# Patient Record
Sex: Female | Born: 1995 | Race: White | Hispanic: No | Marital: Single | Smoking: Never smoker
Health system: Southern US, Community
[De-identification: ages and names within clinical notes are randomized; demographics above are authoritative.]

## PROBLEM LIST (undated history)

## (undated) DIAGNOSIS — F32A Depression, unspecified: Secondary | ICD-10-CM

## (undated) DIAGNOSIS — F329 Major depressive disorder, single episode, unspecified: Secondary | ICD-10-CM

## (undated) DIAGNOSIS — Q211 Atrial septal defect: Secondary | ICD-10-CM

## (undated) DIAGNOSIS — F5000 Anorexia nervosa, unspecified: Secondary | ICD-10-CM

## (undated) DIAGNOSIS — F909 Attention-deficit hyperactivity disorder, unspecified type: Secondary | ICD-10-CM

## (undated) DIAGNOSIS — Q2112 Patent foramen ovale: Secondary | ICD-10-CM

## (undated) DIAGNOSIS — G43909 Migraine, unspecified, not intractable, without status migrainosus: Secondary | ICD-10-CM

## (undated) DIAGNOSIS — J40 Bronchitis, not specified as acute or chronic: Secondary | ICD-10-CM

## (undated) DIAGNOSIS — N83209 Unspecified ovarian cyst, unspecified side: Secondary | ICD-10-CM

## (undated) HISTORY — PX: WISDOM TOOTH EXTRACTION: SHX21

---
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
## 2013-06-05 ENCOUNTER — Emergency Department (HOSPITAL_BASED_OUTPATIENT_CLINIC_OR_DEPARTMENT_OTHER)
Admission: EM | Admit: 2013-06-05 | Discharge: 2013-06-05 | Disposition: A | Discharge status: Home / Self Care | Payer: BC Managed Care – PPO | Attending: Emergency Medicine | Admitting: Emergency Medicine

## 2013-06-05 ENCOUNTER — Encounter (HOSPITAL_BASED_OUTPATIENT_CLINIC_OR_DEPARTMENT_OTHER): Payer: Self-pay | Admitting: Emergency Medicine

## 2013-06-05 DIAGNOSIS — J3489 Other specified disorders of nose and nasal sinuses: Secondary | ICD-10-CM | POA: Insufficient documentation

## 2013-06-05 DIAGNOSIS — H1045 Other chronic allergic conjunctivitis: Secondary | ICD-10-CM | POA: Insufficient documentation

## 2013-06-05 DIAGNOSIS — Z792 Long term (current) use of antibiotics: Secondary | ICD-10-CM | POA: Insufficient documentation

## 2013-06-05 DIAGNOSIS — Z79899 Other long term (current) drug therapy: Secondary | ICD-10-CM | POA: Insufficient documentation

## 2013-06-05 DIAGNOSIS — IMO0002 Reserved for concepts with insufficient information to code with codable children: Secondary | ICD-10-CM | POA: Insufficient documentation

## 2013-06-05 DIAGNOSIS — H1010 Acute atopic conjunctivitis, unspecified eye: Secondary | ICD-10-CM

## 2013-06-05 MED ORDER — PREDNISONE 50 MG PO TABS
60.0000 mg | ORAL_TABLET | Freq: Once | ORAL | Status: AC
  Administered 2013-06-05: 60 mg via ORAL
  Filled 2013-06-05 (×2): qty 1

## 2013-06-05 MED ORDER — CIPROFLOXACIN HCL 0.3 % OP SOLN
1.0000 [drp] | OPHTHALMIC | Status: DC
  Administered 2013-06-05: 1 [drp] via OPHTHALMIC
  Filled 2013-06-05: qty 2.5

## 2013-06-05 MED ORDER — FLUORESCEIN SODIUM 1 MG OP STRP
1.0000 | ORAL_STRIP | Freq: Once | OPHTHALMIC | Status: AC
  Administered 2013-06-05: 1 via OPHTHALMIC
  Filled 2013-06-05: qty 1

## 2013-06-05 NOTE — ED Notes (Signed)
ptm reports awoke this morning with moderate eye pain and blurred vision denies injury or event prior to problem

## 2013-06-05 NOTE — Discharge Instructions (Signed)

## 2013-06-05 NOTE — ED Provider Notes (Signed)
CSN: [id number]     Arrival date & time 06/05/13  2032 History   First MD Initiated Contact with Patient 06/05/13 2103    Scribed for No att. providers found, the patient was seen in room MH12/MH12. This chart was scribed by Lewanda Rife, ED scribe. Patient's care was started at [facility] PM  Chief Complaint  Patient presents with  . Eye Pain     (Consider location/radiation/quality/duration/timing/severity/associated sxs/prior Treatment) The history is provided by the patient. No language interpreter was used.   HPI Comments: Carla James is a 18 y.o. female who presents to the Emergency Department complaining of constant moderate bilateral eye pain onset this morning upon waking. Describes eye pain as foreign body sensation. Reports associated mildly blurry vision, and mild rhinorrhea. 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 Reports finding an extra contact in right eye today. Reports trying benadryl with very mild relief of symptoms. Denies any aggravating factors. Denies associated eye discharge, recent trauma/injury to eyes, fever, dysphagia, otalgia, and photophobia.   Past Medical History  Diagnosis Date  . Bronchitis    Past Surgical History  Procedure Laterality Date  . Wisdom tooth extraction     History reviewed. No pertinent family history. History  Substance Use Topics  . Smoking status: Never Smoker   . Smokeless tobacco: Not on file  . Alcohol Use: Yes   OB History   Grav Para Term Preterm Abortions TAB SAB Ect Mult Living                 Review of Systems  Constitutional: Negative for fever.  Eyes: Positive for pain. Negative for photophobia, discharge, itching and visual disturbance.  Psychiatric/Behavioral: Negative for confusion.      Allergies  Cefzil  Home Medications   Current Outpatient Rx  Name  Route  Sig  Dispense  Refill  . albuterol (PROVENTIL HFA;VENTOLIN HFA) 108 (90 BASE) MCG/ACT inhaler   Inhalation   Inhale 2 puffs into the lungs every  2 (two) hours as needed for wheezing or shortness of breath (cough).   1 Inhaler   0   . amoxicillin (AMOXIL) 875 MG tablet   Oral   Take 875 mg by mouth 2 (two) times daily.         Marland Kitchen azithromycin (ZITHROMAX Z-PAK) 250 MG tablet      2 po day one, then 1 daily x 4 days   5 tablet   0   . predniSONE (DELTASONE) 20 MG tablet      3 tabs po day one, then 2 po daily x 4 days   11 tablet   0    BP 126/88  Pulse 95  Temp(Src) 98.6 F (37 C) (Oral)  Resp 16  Ht 5\' 4"  (1.626 m)  Wt 180 lb (81.647 kg)  BMI 30.88 kg/m2  SpO2 100%  LMP 05/29/2013 Physical Exam  Nursing note and vitals reviewed. Constitutional: She is oriented to person, place, and time. She appears well-developed and well-nourished. No distress.  HENT:  Head: Normocephalic and atraumatic.  Right Ear: Tympanic membrane, external ear and ear canal normal.  Left Ear: Tympanic membrane, external ear and ear canal normal.  Nose: Nose normal.  Mouth/Throat: Uvula is midline and oropharynx is clear and moist. No trismus in the jaw. No uvula swelling. No oropharyngeal exudate, posterior oropharyngeal edema or posterior oropharyngeal erythema.  Eyes: EOM are normal. Pupils are equal, round, and reactive to light. Right eye exhibits no discharge and no exudate. Left eye exhibits  no discharge and no exudate. Right conjunctiva is injected (mild). Left conjunctiva is injected (mild ). No scleral icterus.  Slit lamp exam:      The right eye shows no corneal abrasion, no corneal ulcer and no fluorescein uptake.       The left eye shows no corneal abrasion, no corneal ulcer and no fluorescein uptake.  No swelling, or surrounding erythema noted to bilateral eye lids  No dendritic lesions noted.   Visual Acuity - Bilateral Near: 20/70 (no corrective lens used for eye acuity test) ; R Near: 20/100 ; L Near: 20/200   Neck: Neck supple. No tracheal deviation present.  Cardiovascular: Normal rate and regular rhythm.    Pulmonary/Chest: Effort normal and breath sounds normal. No respiratory distress. She has no wheezes.  Musculoskeletal: Normal range of motion.  Neurological: She is alert and oriented to person, place, and time.  Skin: Skin is warm and dry.  Psychiatric: She has a normal mood and affect. Her behavior is normal.    ED Course  Procedures (including critical care time)  COORDINATION OF CARE:  Nursing notes reviewed. Vital signs reviewed. Initial pt interview and examination performed.   3:24 PM-Discussed work up plan with pt at bedside, which includes visual acuity, and fluorescein stain. Pt agrees with plan.   Treatment plan initiated: Medications  fluorescein ophthalmic strip 1 strip (1 strip Both Eyes Given by Other 06/05/13 2135)  predniSONE (DELTASONE) tablet 60 mg (60 mg Oral Given 06/05/13 2210)     Initial diagnostic testing ordered.    Labs Review Labs Reviewed - No data to display Imaging Review No results found.   EKG Interpretation None      MDM   Final diagnoses:  Allergic conjunctivitis   Presents to the ER with bilateral eye irritation. Fluorescein examination is negative, no concern for HSV keratitis, abrasion, corneal ulcer. She does wear contacts, told not to wear any contacts until improved. Has some minor systemic symptoms of allergic reaction in addition. We'll treat with prednisone.  I personally performed the services described in this documentation, which was scribed in my presence. The recorded information has been reviewed and is accurate.    Gilda Creasehristopher J. Pollina, MD 06/08/13 [phone]

## 2014-12-09 ENCOUNTER — Emergency Department (HOSPITAL_BASED_OUTPATIENT_CLINIC_OR_DEPARTMENT_OTHER)
Admission: EM | Admit: 2014-12-09 | Discharge: 2014-12-10 | Disposition: A | Discharge status: Home / Self Care | Payer: BLUE CROSS/BLUE SHIELD | Attending: Emergency Medicine | Admitting: Emergency Medicine

## 2014-12-09 ENCOUNTER — Encounter (HOSPITAL_BASED_OUTPATIENT_CLINIC_OR_DEPARTMENT_OTHER): Payer: Self-pay | Admitting: *Deleted

## 2014-12-09 DIAGNOSIS — F909 Attention-deficit hyperactivity disorder, unspecified type: Secondary | ICD-10-CM | POA: Insufficient documentation

## 2014-12-09 DIAGNOSIS — Z79899 Other long term (current) drug therapy: Secondary | ICD-10-CM | POA: Insufficient documentation

## 2014-12-09 DIAGNOSIS — Z8659 Personal history of other mental and behavioral disorders: Secondary | ICD-10-CM | POA: Insufficient documentation

## 2014-12-09 DIAGNOSIS — Z8709 Personal history of other diseases of the respiratory system: Secondary | ICD-10-CM | POA: Insufficient documentation

## 2014-12-09 DIAGNOSIS — R1032 Left lower quadrant pain: Secondary | ICD-10-CM

## 2014-12-09 DIAGNOSIS — Z3202 Encounter for pregnancy test, result negative: Secondary | ICD-10-CM | POA: Insufficient documentation

## 2014-12-09 DIAGNOSIS — Z8742 Personal history of other diseases of the female genital tract: Secondary | ICD-10-CM | POA: Diagnosis not present

## 2014-12-09 DIAGNOSIS — Z792 Long term (current) use of antibiotics: Secondary | ICD-10-CM | POA: Insufficient documentation

## 2014-12-09 DIAGNOSIS — Q211 Atrial septal defect: Secondary | ICD-10-CM | POA: Diagnosis not present

## 2014-12-09 DIAGNOSIS — G43909 Migraine, unspecified, not intractable, without status migrainosus: Secondary | ICD-10-CM | POA: Insufficient documentation

## 2014-12-09 HISTORY — DX: Anorexia nervosa, unspecified: F50.00

## 2014-12-09 HISTORY — DX: Major depressive disorder, single episode, unspecified: F32.9

## 2014-12-09 HISTORY — DX: Depression, unspecified: F32.A

## 2014-12-09 HISTORY — DX: Migraine, unspecified, not intractable, without status migrainosus: G43.909

## 2014-12-09 HISTORY — DX: Patent foramen ovale: Q21.12

## 2014-12-09 HISTORY — DX: Atrial septal defect: Q21.1

## 2014-12-09 HISTORY — DX: Unspecified ovarian cyst, unspecified side: N83.209

## 2014-12-09 HISTORY — DX: Attention-deficit hyperactivity disorder, unspecified type: F90.9

## 2014-12-09 NOTE — ED Notes (Signed)
C/o abd pain, onset 9/25, also dizziness, syncope, cold sx, fever (highest 103), nv, recent flight to [place], phenergan not helping, last phenergan taken 2 hrs ago (vomited), last emesis PTA. (denies: diarrhea, bleeding), also took [facility] severe cold 8 hrs ago w/o relief or change. Alert, NAD, calm, interactive, speech clear, "feels near syncopal", mentions h/o PFO with concern for clotting.

## 2014-12-10 ENCOUNTER — Emergency Department (HOSPITAL_BASED_OUTPATIENT_CLINIC_OR_DEPARTMENT_OTHER)
Admit: 2014-12-10 | Discharge: 2014-12-10 | Disposition: A | Discharge status: Home / Self Care | Payer: BLUE CROSS/BLUE SHIELD | Attending: Emergency Medicine | Admitting: Emergency Medicine

## 2014-12-10 LAB — URINALYSIS, ROUTINE W REFLEX MICROSCOPIC
BILIRUBIN URINE: NEGATIVE
GLUCOSE, UA: NEGATIVE mg/dL
HGB URINE DIPSTICK: NEGATIVE
Ketones, ur: NEGATIVE mg/dL
Nitrite: NEGATIVE
PH: 7 (ref 5.0–8.0)
Protein, ur: NEGATIVE mg/dL
SPECIFIC GRAVITY, URINE: 1.024 (ref 1.005–1.030)
Urobilinogen, UA: 1 mg/dL (ref 0.0–1.0)

## 2014-12-10 LAB — CBC WITH DIFFERENTIAL/PLATELET
BASOS PCT: 0 %
Basophils Absolute: 0 10*3/uL (ref 0.0–0.1)
EOS ABS: 0.1 10*3/uL (ref 0.0–0.7)
Eosinophils Relative: 1 %
HEMATOCRIT: 37.5 % (ref 36.0–46.0)
HEMOGLOBIN: 12.5 g/dL (ref 12.0–15.0)
LYMPHS PCT: 37 %
Lymphs Abs: 2.2 10*3/uL (ref 0.7–4.0)
MCH: 30.4 pg (ref 26.0–34.0)
MCHC: 33.3 g/dL (ref 30.0–36.0)
MCV: 91.2 fL (ref 78.0–100.0)
MONOS PCT: 14 %
Monocytes Absolute: 0.8 10*3/uL (ref 0.1–1.0)
NEUTROS ABS: 2.8 10*3/uL (ref 1.7–7.7)
NEUTROS PCT: 48 %
Platelets: 196 10*3/uL (ref 150–400)
RBC: 4.11 MIL/uL (ref 3.87–5.11)
RDW: 12.9 % (ref 11.5–15.5)
WBC: 5.9 10*3/uL (ref 4.0–10.5)

## 2014-12-10 LAB — PREGNANCY, URINE: Preg Test, Ur: NEGATIVE

## 2014-12-10 LAB — COMPREHENSIVE METABOLIC PANEL
ALBUMIN: 4.1 g/dL (ref 3.5–5.0)
ALK PHOS: 82 U/L (ref 38–126)
ALT: 40 U/L (ref 14–54)
AST: 36 U/L (ref 15–41)
Anion gap: 8 (ref 5–15)
BILIRUBIN TOTAL: 0.7 mg/dL (ref 0.3–1.2)
BUN: 6 mg/dL (ref 6–20)
CALCIUM: 8.7 mg/dL — AB (ref 8.9–10.3)
CO2: 27 mmol/L (ref 22–32)
CREATININE: 0.62 mg/dL (ref 0.44–1.00)
Chloride: 103 mmol/L (ref 101–111)
GFR calc Af Amer: >60 mL/min (ref >60)
GFR calc non Af Amer: >60 mL/min (ref >60)
GLUCOSE: 112 mg/dL — AB (ref 65–99)
Potassium: 3.5 mmol/L (ref 3.5–5.1)
SODIUM: 138 mmol/L (ref 135–145)
TOTAL PROTEIN: 7.2 g/dL (ref 6.5–8.1)

## 2014-12-10 LAB — URINE MICROSCOPIC-ADD ON

## 2014-12-10 LAB — MONONUCLEOSIS SCREEN: Mono Screen: NEGATIVE

## 2014-12-10 LAB — LIPASE, BLOOD: Lipase: 24 U/L (ref 22–51)

## 2014-12-10 MED ORDER — ONDANSETRON HCL 4 MG/2ML IJ SOLN
4.0000 mg | Freq: Once | INTRAMUSCULAR | Status: AC
  Administered 2014-12-10: 4 mg via INTRAVENOUS
  Filled 2014-12-10: qty 2

## 2014-12-10 MED ORDER — HYDROCODONE-ACETAMINOPHEN 5-325 MG PO TABS: 1.0000 | ORAL_TABLET | Freq: Four times a day (QID) | ORAL | Status: DC | PRN

## 2014-12-10 MED ORDER — FENTANYL CITRATE (PF) 100 MCG/2ML IJ SOLN
100.0000 ug | Freq: Once | INTRAMUSCULAR | Status: AC
  Administered 2014-12-10: 100 ug via INTRAVENOUS
  Filled 2014-12-10: qty 2

## 2014-12-10 MED ORDER — SODIUM CHLORIDE 0.9 % IV BOLUS (SEPSIS)
1000.0000 mL | Freq: Once | INTRAVENOUS | Status: AC
  Administered 2014-12-10: 1000 mL via INTRAVENOUS

## 2014-12-10 MED ORDER — ONDANSETRON 8 MG PO TBDP: 8.0000 mg | ORAL_TABLET | Freq: Three times a day (TID) | ORAL | Status: DC | PRN

## 2014-12-10 NOTE — ED Provider Notes (Signed)
CSN: [id number]     Arrival date & time 12/09/14  2254 History   First MD Initiated Contact with Patient 12/10/14 0159     Chief Complaint  Patient presents with  . Abdominal Pain     (Consider location/radiation/quality/duration/timing/severity/associated sxs/prior Treatment) HPI  This is a 19 year old female with a one-week history of intermittent left lower quadrant pain. She describes the pain as sharp. It is been associated with lightheadedness, cold symptoms which include subjective fever, throat discomfort and chest congestion, and nausea and vomiting. She denies diarrhea, vaginal bleeding or vaginal discharge. She has taken Phenergan and over-the-counter cold medications without relief. Pain is been severe at times but is moderate presently. Pain is worse with movement or palpation. She has a history of ovarian cysts but states this pain is different and more severe.   Past Medical History  Diagnosis Date  . Bronchitis   . PFO (patent foramen ovale)   . ADHD (attention deficit hyperactivity disorder)   . Migraines   . Ovarian cyst   . Depression   . Anorexia nervosa    Past Surgical History  Procedure Laterality Date  . Wisdom tooth extraction     History reviewed. No pertinent family history. Social History  Substance Use Topics  . Smoking status: Never Smoker   . Smokeless tobacco: None  . Alcohol Use: Yes   OB History    No data available     Review of Systems  All other systems reviewed and are negative.   Allergies  Cefzil  Home Medications   Prior to Admission medications   Medication Sig Start Date End Date Taking? Authorizing Provider  Lisdexamfetamine Dimesylate (VYVANSE PO) Take by mouth.   Yes Historical Provider, MD  SUMAtriptan (IMITREX) 25 MG tablet Take 25 mg by mouth every 2 (two) hours as needed for migraine. May repeat in 2 hours if headache persists or recurs.   Yes Historical Provider, MD  albuterol (PROVENTIL HFA;VENTOLIN HFA) 108 (90  BASE) MCG/ACT inhaler Inhale 2 puffs into the lungs every 2 (two) hours as needed for wheezing or shortness of breath (cough). 12/07/11   Wayland Salinas, MD  amoxicillin (AMOXIL) 875 MG tablet Take 875 mg by mouth 2 (two) times daily.    Historical Provider, MD  azithromycin (ZITHROMAX Z-PAK) 250 MG tablet 2 po day one, then 1 daily x 4 days 12/07/11   Wayland Salinas, MD  predniSONE (DELTASONE) 20 MG tablet 3 tabs po day one, then 2 po daily x 4 days 12/07/11   Wayland Salinas, MD   BP 116/63 mmHg  Pulse 102  Temp(Src) 99.2 F (37.3 C) (Oral)  Resp 18  Ht  (1.626 m)  Wt 177 lb (80.287 kg)  BMI 30.37 kg/m2  SpO2 99%  LMP 11/11/2014   Physical Exam General: Well-developed, well-nourished female in no acute distress; appearance consistent with age of record HENT: normocephalic; atraumatic; pharynx normal Eyes: pupils equal, round and reactive to light; extraocular muscles intact Neck: supple; no lymphadenopathy Heart: regular rate and rhythm Lungs: clear to auscultation bilaterally Abdomen: soft; nondistended; left suprapubic tenderness, left upper quadrant tenderness; no masses or hepatosplenomegaly; bowel sounds present GU: Mild left CVA tenderness Extremities: No deformity; full range of motion; pulses normal Neurologic: Awake, alert and oriented; motor function intact in all extremities and symmetric; no facial droop Skin: Warm and dry Psychiatric: Normal mood and affect   ED Course  Procedures (including critical care time)   EKG Interpretation   Date/Time:  Sunday December 10 2014 01:49:22 EDT Ventricular Rate:  94 PR Interval:  144 QRS Duration: 96 QT Interval:  348 QTC Calculation: 435 R Axis:   81 Text Interpretation:  Normal sinus rhythm Normal ECG No previous ECGs  available Confirmed by Keirstin Musil  MD, Jonny Ruiz ([id number]) on 12/10/2014 1:57:03 AM      MDM   Nursing notes and vitals signs, including pulse oximetry, reviewed.  Summary of this visit's results, reviewed by  myself:  Labs:  Results for orders placed or performed during the hospital encounter of 12/09/14 (from the past 24 hour(s))  CBC with Differential     Status: None   Collection Time: 12/10/14 12:01 AM  Result Value Ref Range   WBC 5.9 4.0 - 10.5 K/uL   RBC 4.11 3.87 - 5.11 MIL/uL   Hemoglobin 12.5 12.0 - 15.0 g/dL   HCT 60.4 54.0 - 98.1 %   MCV 91.2 78.0 - 100.0 fL   MCH 30.4 26.0 - 34.0 pg   MCHC 33.3 30.0 - 36.0 g/dL   RDW 19.1 47.8 - 29.5 %   Platelets 196 150 - 400 K/uL   Neutrophils Relative % 48 %   Lymphocytes Relative 37 %   Monocytes Relative 14 %   Eosinophils Relative 1 %   Basophils Relative 0 %   Neutro Abs 2.8 1.7 - 7.7 K/uL   Lymphs Abs 2.2 0.7 - 4.0 K/uL   Monocytes Absolute 0.8 0.1 - 1.0 K/uL   Eosinophils Absolute 0.1 0.0 - 0.7 K/uL   Basophils Absolute 0.0 0.0 - 0.1 K/uL   WBC Morphology ATYPICAL LYMPHOCYTES   Comprehensive metabolic panel     Status: Abnormal   Collection Time: 12/10/14 12:01 AM  Result Value Ref Range   Sodium 138 135 - 145 mmol/L   Potassium 3.5 3.5 - 5.1 mmol/L   Chloride 103 101 - 111 mmol/L   CO2 27 22 - 32 mmol/L   Glucose, Bld 112 (H) 65 - 99 mg/dL   BUN 6 6 - 20 mg/dL   Creatinine, Ser 6.21 0.44 - 1.00 mg/dL   Calcium 8.7 (L) 8.9 - 10.3 mg/dL   Total Protein 7.2 6.5 - 8.1 g/dL   Albumin 4.1 3.5 - 5.0 g/dL   AST 36 15 - 41 U/L   ALT 40 14 - 54 U/L   Alkaline Phosphatase 82 38 - 126 U/L   Total Bilirubin 0.7 0.3 - 1.2 mg/dL   GFR calc non Af Amer >60 >60 mL/min   GFR calc Af Amer >60 >60 mL/min   Anion gap 8 5 - 15  Lipase, blood     Status: None   Collection Time: 12/10/14 12:01 AM  Result Value Ref Range   Lipase 24 22 - 51 U/L  Mononucleosis screen     Status: None   Collection Time: 12/10/14 12:01 AM  Result Value Ref Range   Mono Screen NEGATIVE NEGATIVE  Urinalysis, Routine w reflex microscopic (not at [facility])     Status: Abnormal   Collection Time: 12/10/14  2:10 AM  Result Value Ref Range   Color, Urine YELLOW  YELLOW   APPearance CLOUDY (A) CLEAR   Specific Gravity, Urine 1.024 1.005 - 1.030   pH 7.0 5.0 - 8.0   Glucose, UA NEGATIVE NEGATIVE mg/dL   Hgb urine dipstick NEGATIVE NEGATIVE   Bilirubin Urine NEGATIVE NEGATIVE   Ketones, ur NEGATIVE NEGATIVE mg/dL   Protein, ur NEGATIVE NEGATIVE mg/dL   Urobilinogen, UA 1.0 0.0 - 1.0 mg/dL  Nitrite NEGATIVE NEGATIVE   Leukocytes, UA MODERATE (A) NEGATIVE  Pregnancy, urine     Status: None   Collection Time: 12/10/14  2:10 AM  Result Value Ref Range   Preg Test, Ur NEGATIVE NEGATIVE  Urine microscopic-add on     Status: Abnormal   Collection Time: 12/10/14  2:10 AM  Result Value Ref Range   Squamous Epithelial / LPF FEW (A) RARE   WBC, UA 3-6 <3 WBC/hpf   RBC / HPF 0-2 <3 RBC/hpf   Bacteria, UA FEW (A) RARE   4:27 AM Patient feeling better after IV medications. She was advised of unremarkable lab workup. I suspect her pain is due to an ovarian cyst will have her return for ultrasound. She may also have a viral illness superimposed.    Paula Libra, MD 12/10/14 [phone]

## 2014-12-10 NOTE — ED Notes (Signed)
Patient given ice chips and water. 

## 2015-08-29 ENCOUNTER — Emergency Department (HOSPITAL_BASED_OUTPATIENT_CLINIC_OR_DEPARTMENT_OTHER)
Admission: EM | Admit: 2015-08-29 | Discharge: 2015-08-29 | Disposition: A | Discharge status: Home / Self Care | Payer: BLUE CROSS/BLUE SHIELD | Attending: Emergency Medicine | Admitting: Emergency Medicine

## 2015-08-29 ENCOUNTER — Encounter (HOSPITAL_BASED_OUTPATIENT_CLINIC_OR_DEPARTMENT_OTHER): Payer: Self-pay | Admitting: *Deleted

## 2015-08-29 DIAGNOSIS — T63301A Toxic effect of unspecified spider venom, accidental (unintentional), initial encounter: Secondary | ICD-10-CM

## 2015-08-29 DIAGNOSIS — Y929 Unspecified place or not applicable: Secondary | ICD-10-CM | POA: Insufficient documentation

## 2015-08-29 DIAGNOSIS — S50862A Insect bite (nonvenomous) of left forearm, initial encounter: Secondary | ICD-10-CM | POA: Insufficient documentation

## 2015-08-29 DIAGNOSIS — Y939 Activity, unspecified: Secondary | ICD-10-CM | POA: Diagnosis not present

## 2015-08-29 DIAGNOSIS — Y999 Unspecified external cause status: Secondary | ICD-10-CM | POA: Insufficient documentation

## 2015-08-29 DIAGNOSIS — Z79899 Other long term (current) drug therapy: Secondary | ICD-10-CM | POA: Diagnosis not present

## 2015-08-29 DIAGNOSIS — F909 Attention-deficit hyperactivity disorder, unspecified type: Secondary | ICD-10-CM | POA: Diagnosis not present

## 2015-08-29 DIAGNOSIS — F329 Major depressive disorder, single episode, unspecified: Secondary | ICD-10-CM | POA: Insufficient documentation

## 2015-08-29 DIAGNOSIS — R110 Nausea: Secondary | ICD-10-CM | POA: Diagnosis not present

## 2015-08-29 DIAGNOSIS — L02414 Cutaneous abscess of left upper limb: Secondary | ICD-10-CM | POA: Diagnosis present

## 2015-08-29 DIAGNOSIS — W57XXXA Bitten or stung by nonvenomous insect and other nonvenomous arthropods, initial encounter: Secondary | ICD-10-CM | POA: Insufficient documentation

## 2015-08-29 DIAGNOSIS — L03114 Cellulitis of left upper limb: Secondary | ICD-10-CM | POA: Insufficient documentation

## 2015-08-29 MED ORDER — DOXYCYCLINE HYCLATE 100 MG PO CAPS: 100.0000 mg | ORAL_CAPSULE | Freq: Two times a day (BID) | ORAL | Status: AC

## 2015-08-29 NOTE — ED Notes (Signed)
Pt c/o abscess to left forearm from insect bite x 2 days ago

## 2015-08-29 NOTE — ED Provider Notes (Signed)
CSN: [id number]     Arrival date & time 08/29/15  1940 History  By signing my name below, I, [facility], attest that this documentation has been prepared under the direction and in the presence of Benjiman Core, MD. Electronically Signed: Randell Patient, ED Scribe. 08/29/2015. 8:03 PM.     Chief Complaint  Patient presents with  . Abscess    The history is provided by the patient. No language interpreter was used.   HPI Comments: Carla James is a 20 y.o. female with an hx of PFOwho presents to the Emergency Department complaining of constant, gradually increasing in redness and swelling, abscess to her left forearm that she noticed this morning upon waking. Pt states that she felt a sharp sting on her left arm after which she saw a small brown spider in the same area but was asymptomatic until this morning when she noticed a small, red blister on her left forearm. She notes that the blister gradually increased in swelling and erythema and that the pustule burst this afternoon. She reports associated nausea. Per pt, she was seen at an urgent care for this same complaint earlier today where she was advised to come to the ED for further evaluation. She is allergic to Cefzil. Denies taking Coumadin or any other blood thinner medications. Denies fevers and chills.  Past Medical History  Diagnosis Date  . Bronchitis   . PFO (patent foramen ovale)   . ADHD (attention deficit hyperactivity disorder)   . Migraines   . Ovarian cyst   . Depression   . Anorexia nervosa    Past Surgical History  Procedure Laterality Date  . Wisdom tooth extraction     History reviewed. No pertinent family history. Social History  Substance Use Topics  . Smoking status: Never Smoker   . Smokeless tobacco: None  . Alcohol Use: Yes   OB History    No data available     Review of Systems  Constitutional: Negative for fever and chills.  Gastrointestinal: Positive for nausea.  Skin:  Positive for color change and wound.      Allergies  Cefzil  Home Medications   Prior to Admission medications   Medication Sig Start Date End Date Taking? Authorizing Provider  triamcinolone ointment (KENALOG) 0.5 % Apply 1 application topically 2 (two) times daily.   Yes Historical Provider, MD  doxycycline (VIBRAMYCIN) 100 MG capsule Take 1 capsule (100 mg total) by mouth 2 (two) times daily. 08/29/15   Benjiman Core, MD  Lisdexamfetamine Dimesylate (VYVANSE PO) Take 30 mg by mouth.     Historical Provider, MD  SUMAtriptan (IMITREX) 25 MG tablet Take 25 mg by mouth every 2 (two) hours as needed for migraine. May repeat in 2 hours if headache persists or recurs.    Historical Provider, MD   BP 128/92 mmHg  Pulse 115  Temp(Src) 98.7 F (37.1 C) (Oral)  Resp 18  Ht  (1.626 m)  Wt 165 lb (74.844 kg)  BMI 28.31 kg/m2  SpO2 100% Physical Exam  Constitutional: She appears well-developed and well-nourished. No distress.  HENT:  Head: Normocephalic.  Neck: Normal range of motion.  Musculoskeletal: Normal range of motion.  Skin: Skin is warm.  Erythema and left forearm with mild induration and some swelling. Approximately 2-1/2 cm in diameter centrally there is a 5 mm erythematous area that according to pictures was previous pustule. No drainage at this time.  Nursing note and vitals reviewed.   ED Course  Procedures  COORDINATION OF CARE: 7:55 PM Will prescribe doxycycline. Will discharge pt. Discussed treatment plan with pt at bedside and pt agreed to plan.   MDM   Final diagnoses:  Spider bite, accidental or unintentional, initial encounter  Cellulitis of left upper extremity    Patient with possible spider bite possible cellulitis. Does not appear to be current abscess but did have visualization and a picture of her pustule. Will treat with antibiotics and discharge home. I personally performed the services described in this documentation, which was scribed in  my presence. The recorded information has been reviewed and is accurate.      Benjiman CoreNathan Gavriel Holzhauer, MD 08/29/15 2024

## 2015-08-29 NOTE — Discharge Instructions (Signed)
Spider Bite Spider bites are not common. When spider bites do happen, most do not cause serious health problems. There are only a few types of spider bites that can cause serious health problems. CAUSES A spider bite usually happens when a person accidentally makes contact with a spider in a way that traps the spider against the person's skin. SYMPTOMS Symptoms may vary depending on the type of spider. Some spider bites may cause symptoms within 1 hour after the bite. For other spider bites, it may take 1-2 days for symptoms to develop. Common symptoms include:  Redness and swelling in the area of the bite.  Discomfort or pain in the area of the bite. A few types of spiders, such as the black widow spider or the brown recluse spider, can inject poison (venom) into a bite wound. This venom causes more serious symptoms. Symptoms of a venomous spider bite vary, and may include:  Muscle cramps.  Nausea, vomiting, or abdominal pain.  Fever.  A skin sore (lesion) that spreads. This can break into an open wound (skin ulcer).  Light-headedness or dizziness. DIAGNOSIS This condition may be diagnosed based on your symptoms and a physical exam. Your health care provider will ask about the history of your injury and any details you may have about the spider. This may help to determine what type of spider it was that bit you. TREATMENT Many spider bites do not require treatment. If needed, treatment may include:  Icing and keeping the bite area raised (elevated).  Over-the-counter or prescription medicines to help control symptoms.  A tetanus shot.  Antibiotic medicines. HOME CARE INSTRUCTIONS Medicines  Take or apply over-the-counter and prescription medicines only as told by your health care provider.  If you were prescribed an antibiotic medicine, take or apply it as told by your health care provider. Do not stop using the antibiotic even if your condition improves. General  Instructions  Do not scratch the bite area.  Keep the bite area clean and dry. Wash the bite area daily with soap and water as told by your health care provider.  If directed, apply ice to the bite area.  Put ice in a plastic bag.  Place a towel between your skin and the bag.  Leave the ice on for 20 minutes, 2-3 times per day.  Elevate the affected area above the level of your heart while you are sitting or lying down, if possible.  Keep all follow-up visits as told by your health care provider. This is important. SEEK MEDICAL CARE IF:  Your bite does not get better after 3 days of treatment.  Your bite turns black or purple.  You have increased redness, swelling, or pain at the site of the bite. SEEK IMMEDIATE MEDICAL CARE IF:  You develop shortness of breath or chest pain.  You have fluid, blood, or pus coming from the bite area.  You have muscle cramps or painful muscle spasms.  You develop abdominal pain, nausea, or vomiting.  You feel unusually tired (fatigued) or sleepy.   This information is not intended to replace advice given to you by your health care provider. Make sure you discuss any questions you have with your health care provider.   Document Released: 04/03/2004 Document Revised: 11/15/2014 Document Reviewed: 07/12/2014 [employer] Interactive Patient Education 2016 [employer]  Cellulitis Cellulitis is an infection of the skin and the tissue beneath it. The infected area is usually red and tender. Cellulitis occurs most often in the arms and  lower legs.  CAUSES  Cellulitis is caused by bacteria that enter the skin through cracks or cuts in the skin. The most common types of bacteria that cause cellulitis are staphylococci and streptococci. SIGNS AND SYMPTOMS   Redness and warmth.  Swelling.  Tenderness or pain.  Fever. DIAGNOSIS  Your health care provider can usually determine what is wrong based on a physical exam. Blood tests may also be  done. TREATMENT  Treatment usually involves taking an antibiotic medicine. HOME CARE INSTRUCTIONS   Take your antibiotic medicine as directed by your health care provider. Finish the antibiotic even if you start to feel better.  Keep the infected arm or leg elevated to reduce swelling.  Apply a warm cloth to the affected area up to 4 times per day to relieve pain.  Take medicines only as directed by your health care provider.  Keep all follow-up visits as directed by your health care provider. SEEK MEDICAL CARE IF:   You notice red streaks coming from the infected area.  Your red area gets larger or turns dark in color.  Your bone or joint underneath the infected area becomes painful after the skin has healed.  Your infection returns in the same area or another area.  You notice a swollen bump in the infected area.  You develop new symptoms.  You have a fever. SEEK IMMEDIATE MEDICAL CARE IF:   You feel very sleepy.  You develop vomiting or diarrhea.  You have a general ill feeling (malaise) with muscle aches and pains.   This information is not intended to replace advice given to you by your health care provider. Make sure you discuss any questions you have with your health care provider.   Document Released: 12/04/2004 Document Revised: 11/15/2014 Document Reviewed: 05/12/2011 [employer] Interactive Patient Education [employer]

## 2015-09-05 ENCOUNTER — Other Ambulatory Visit (HOSPITAL_BASED_OUTPATIENT_CLINIC_OR_DEPARTMENT_OTHER): Payer: Self-pay | Admitting: Nurse Practitioner

## 2015-09-05 DIAGNOSIS — R519 Headache, unspecified: Secondary | ICD-10-CM

## 2015-09-05 DIAGNOSIS — R51 Headache: Principal | ICD-10-CM

## 2015-09-15 ENCOUNTER — Ambulatory Visit (HOSPITAL_BASED_OUTPATIENT_CLINIC_OR_DEPARTMENT_OTHER)
Admission: RE | Admit: 2015-09-15 | Discharge: 2015-09-15 | Disposition: A | Discharge status: Home / Self Care | Payer: BLUE CROSS/BLUE SHIELD | Source: Ambulatory Visit | Attending: Nurse Practitioner | Admitting: Nurse Practitioner

## 2015-09-15 DIAGNOSIS — R51 Headache: Secondary | ICD-10-CM | POA: Diagnosis present

## 2015-09-15 DIAGNOSIS — R519 Headache, unspecified: Secondary | ICD-10-CM

## 2016-12-03 IMAGING — MR MR HEAD W/O CM
7 of 8 series · 39 of 48 positions shown · non-contrast
Comparison: Report of Markeisha Noyola brain MRI without contrast
03/08/2015 (no images available).

CLINICAL DATA: 20-year-old female with 2 months of frequent
headaches, migraines. Pain behind the eyes and at the back of the
head. Associated dizziness and nausea. Initial encounter.

EXAM:
MRI HEAD WITHOUT CONTRAST
TECHNIQUE: Multiplanar, multiecho pulse sequences of the brain and surrounding
structures were obtained without intravenous contrast.

[Series 2: T1 · sagittal · 5.0mm · 0.45mm/px · 3 of 23 slices shown]
[im 1/23]
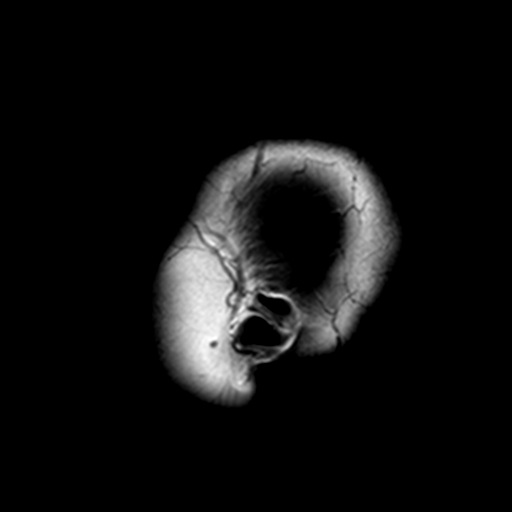
[im 12/23]
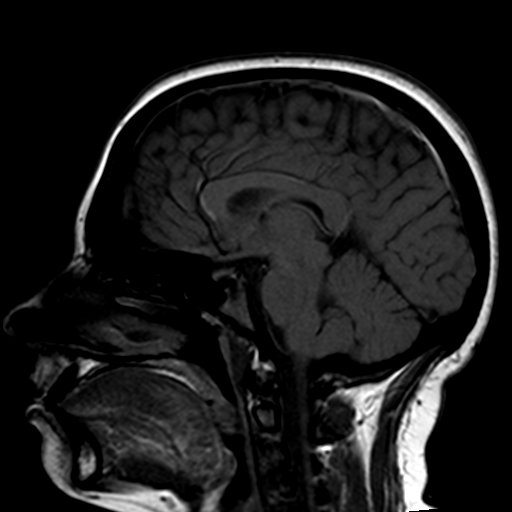
[im 23/23]
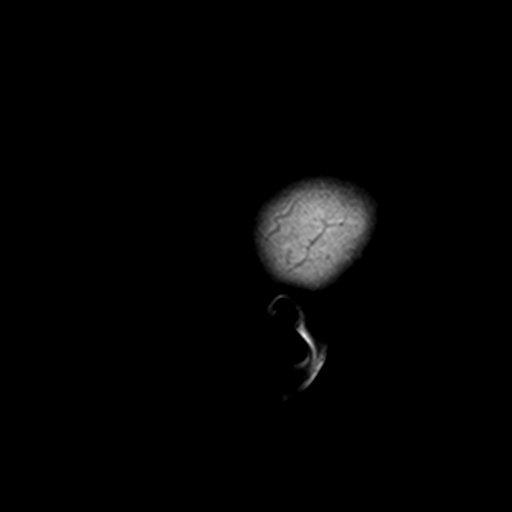

[Series 3: DWI · axial · 3.0mm · 2.19mm/px · z∈[-72,+72]mm · 13 of 90 slices shown (1 of 2)]
[im 1/90]
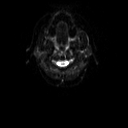
[im 8/90]
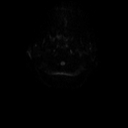
[im 15/90]
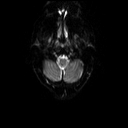
[im 23/90]
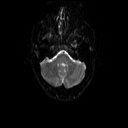
[im 30/90]
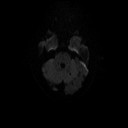
[im 38/90]
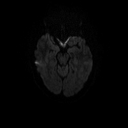
[im 45/90]
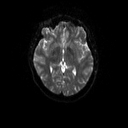
[im 52/90]
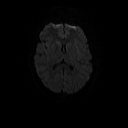
[im 60/90]
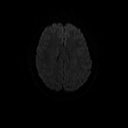
[im 67/90]
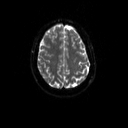
[im 75/90]
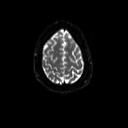
[im 82/90]
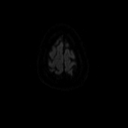
[im 90/90]
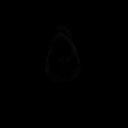

[Series 4: DWI · axial · 3.0mm · 2.19mm/px · z∈[-72,+72]mm · 7 of 45 slices shown (2 of 2)]
[im 1/45]
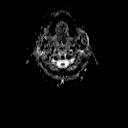
[im 8/45]
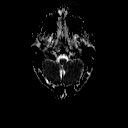
[im 15/45]
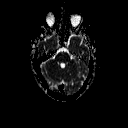
[im 23/45]
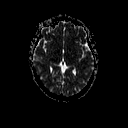
[im 30/45]
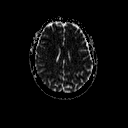
[im 37/45]
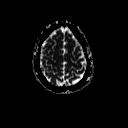
[im 45/45]
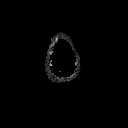

[Series 5: T2 · axial · 5.0mm · 0.45mm/px · z∈[-83,+71]mm · 4 of 23 slices shown (1 of 2)]
[im 1/23]
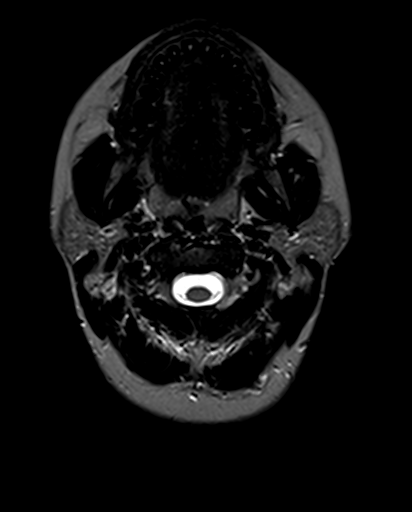
[im 8/23]
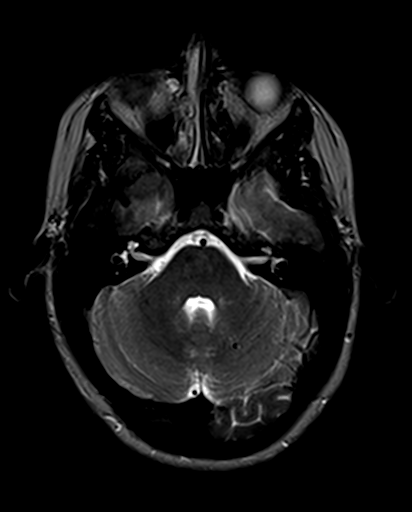
[im 15/23]
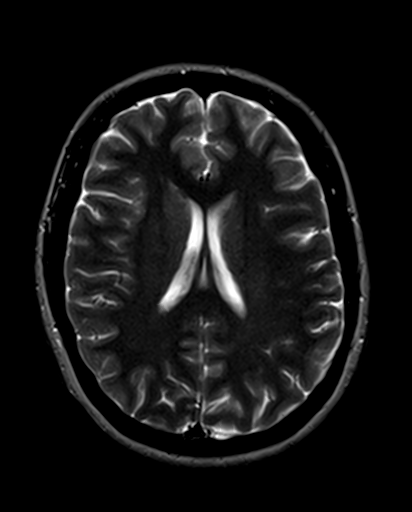
[im 23/23]
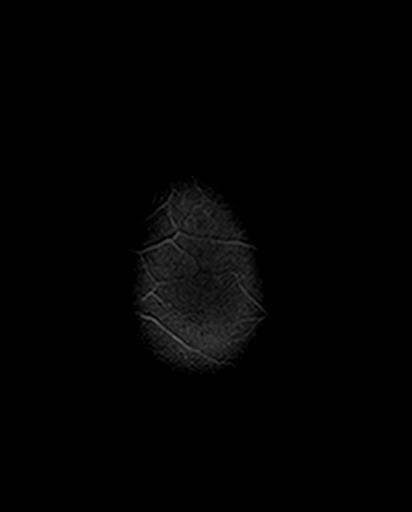

[Series 6: T2 · axial · 5.0mm · 0.45mm/px · z∈[-83,+71]mm · 4 of 23 slices shown (2 of 2)]
[im 1/23]
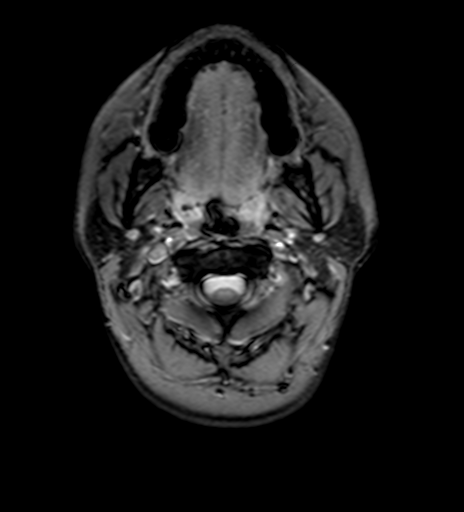
[im 8/23]
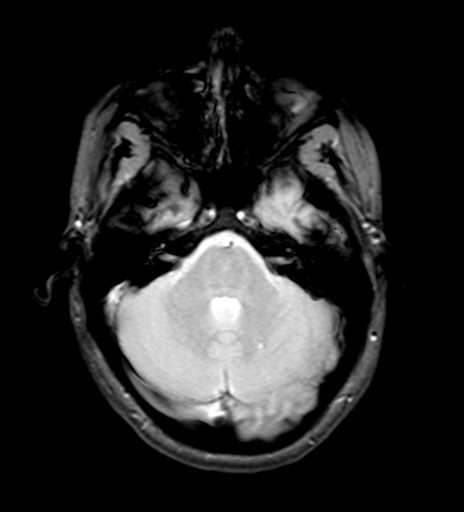
[im 15/23]
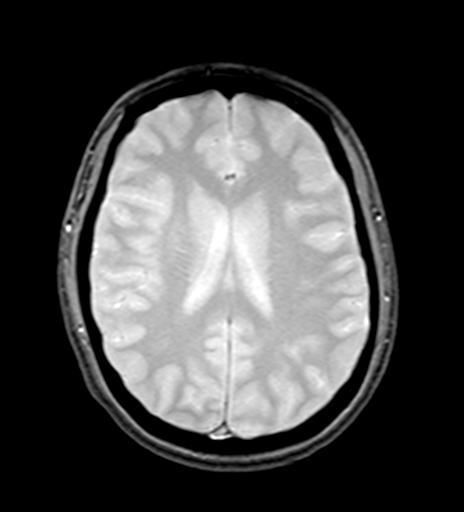
[im 23/23]
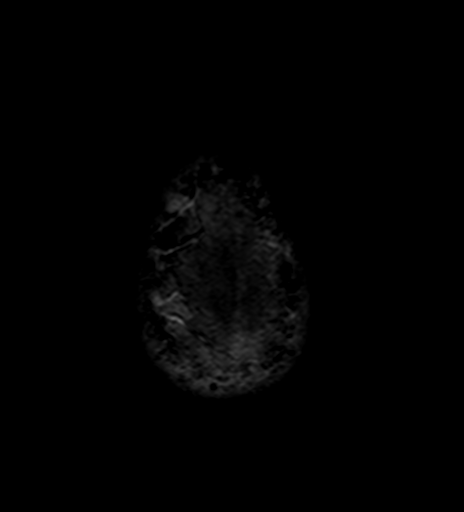

[Series 7: FLAIR · axial · 5.0mm · 0.45mm/px · z∈[-83,+71]mm · 4 of 23 slices shown]
[im 1/23]
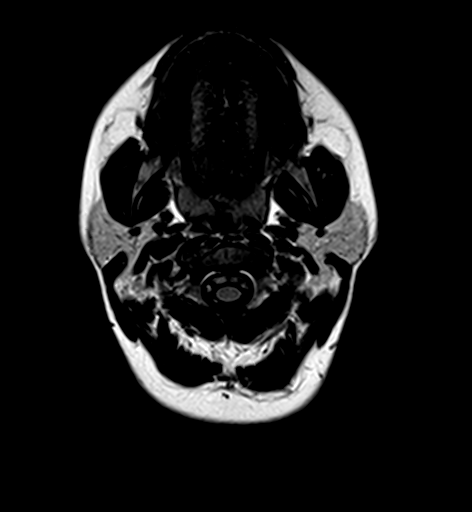
[im 8/23]
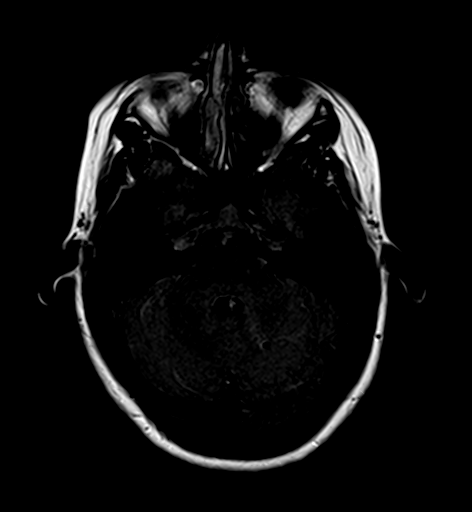
[im 15/23]
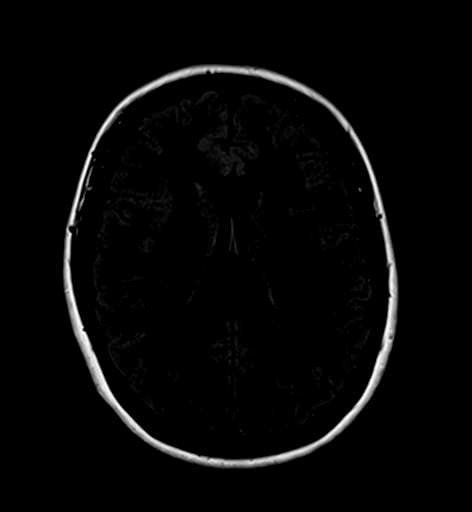
[im 23/23]
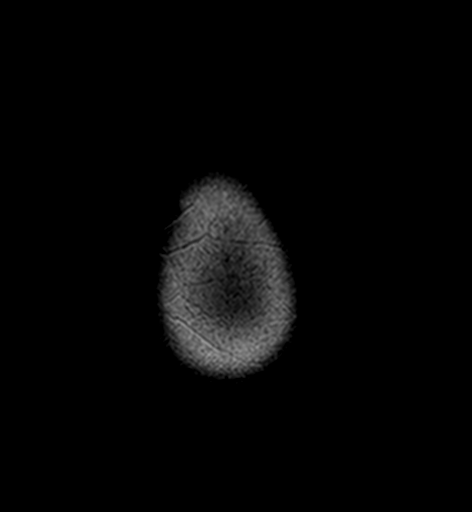

[Series 9: T2 post-contrast · coronal · 5.0mm · 0.45mm/px · 4 of 28 slices shown]
[im 1/28]
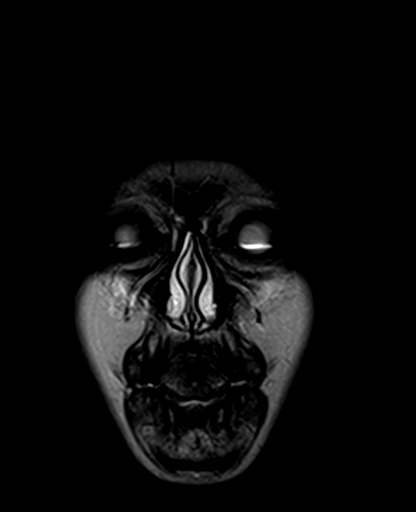
[im 10/28]
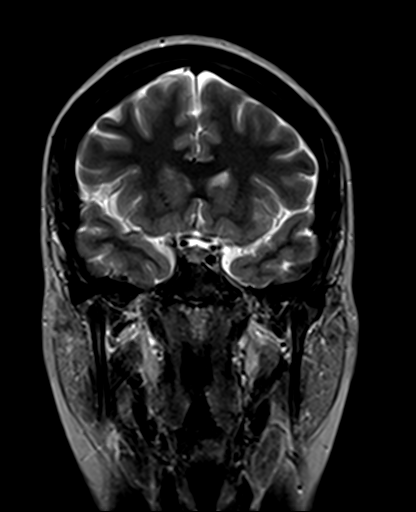
[im 19/28]
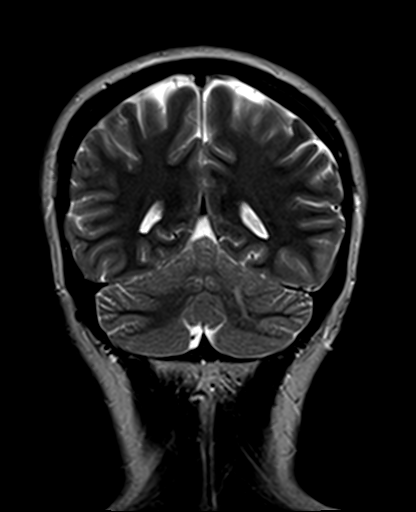
[im 28/28]
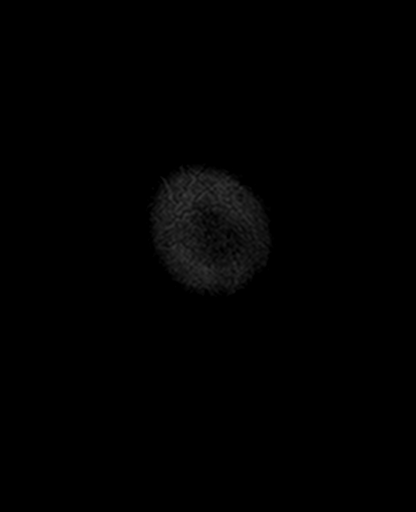

[39 of 48 positions shown; findings below may reference images not displayed]

FINDINGS: No restricted diffusion to suggest acute infarction. No midline
shift, mass effect, evidence of mass lesion, ventriculomegaly,
extra-axial collection or acute intracranial hemorrhage.
Cervicomedullary junction and pituitary are within normal limits.
Negative visualized cervical spine. Major intracranial vascular flow
voids appear normal; incidental left cerebellar developmental venous
anomaly (normal variant). Gray and white matter signal is within
normal limits throughout the brain. No encephalomalacia or chronic
cerebral blood products identified.

Visible internal auditory structures appear normal. Paranasal
sinuses and mastoids are clear. Negative orbit and scalp soft
tissues. Visualized bone marrow signal is within normal limits.
IMPRESSION: Normal noncontrast MRI appearance of the brain.
